# Patient Record
Sex: Male | Born: 1945 | Race: Black or African American | Hispanic: No | Marital: Married | Smoking: Never smoker
Health system: Southern US, Community
[De-identification: ages and names within clinical notes are randomized; demographics above are authoritative.]

## PROBLEM LIST (undated history)

## (undated) DIAGNOSIS — M1990 Unspecified osteoarthritis, unspecified site: Secondary | ICD-10-CM

## (undated) DIAGNOSIS — C801 Malignant (primary) neoplasm, unspecified: Secondary | ICD-10-CM

## (undated) HISTORY — PX: PROSTATE SURGERY: SHX751

---

## 2021-03-14 NOTE — Patient Instructions (Signed)
DUE TO COVID-19 ONLY ONE VISITOR IS ALLOWED TO COME WITH YOU AND STAY IN THE WAITING ROOM ONLY DURING PRE OP AND PROCEDURE DAY OF SURGERY. THE 1 VISITOR  MAY VISIT WITH YOU AFTER SURGERY IN YOUR PRIVATE ROOM DURING VISITING HOURS ONLY!               Curtis Olsen   Your procedure is scheduled on: 03/28/21   Report to [facility] Main  Entrance   Report to admitting at : 7:30 AM     Call this number if you have problems the morning of surgery [phone]    Remember: NO SOLID FOOD AFTER MIDNIGHT THE NIGHT PRIOR TO SURGERY. NOTHING BY MOUTH EXCEPT CLEAR LIQUIDS UNTIL : 7:00 AM. PLEASE FINISH ENSURE DRINK PER SURGEON ORDER  WHICH NEEDS TO BE COMPLETED AT : 7:00 AM.   CLEAR LIQUID DIET  Foods Allowed                                                                     Foods Excluded  Coffee and tea, regular and decaf                             liquids that you cannot  Plain Jell-O any favor except red or purple                                           see through such as: Fruit ices (not with fruit pulp)                                     milk, soups, orange juice  Iced Popsicles                                    All solid food Carbonated beverages, regular and diet                                    Cranberry, grape and apple juices Sports drinks like Gatorade Lightly seasoned clear broth or consume(fat free) Sugar, honey syrup  Sample Menu Breakfast                                Lunch                                     Supper Cranberry juice                    Beef broth                            Chicken broth Jell-O  Grape juice                           Apple juice Coffee or tea                        Jell-O                                      Popsicle                                                Coffee or tea                        Coffee or tea  _____________________________________________________________________  BRUSH YOUR  TEETH MORNING OF SURGERY AND RINSE YOUR MOUTH OUT, NO CHEWING GUM CANDY OR MINTS.                               You may not have any metal on your body including hair pins and              piercings  Do not wear jewelry, lotions, powders or perfumes, deodorant.             Men may shave face and neck.   Do not bring valuables to the hospital. [place].  Contacts, dentures or bridgework may not be worn into surgery.  Leave suitcase in the car. After surgery it may be brought to your room.     Patients discharged the day of surgery will not be allowed to drive home. IF YOU ARE HAVING SURGERY AND GOING HOME THE SAME DAY, YOU MUST HAVE AN ADULT TO DRIVE YOU HOME AND BE WITH YOU FOR 24 HOURS. YOU MAY GO HOME BY TAXI OR UBER OR ORTHERWISE, BUT AN ADULT MUST ACCOMPANY YOU HOME AND STAY WITH YOU FOR 24 HOURS.  Name and phone number of your driver:  Special Instructions: N/A              Please read over the following fact sheets you were given: _____________________________________________________________________           [facility] - Preparing for Surgery Before surgery, you can play an important role.  Because skin is not sterile, your skin needs to be as free of germs as possible.  You can reduce the number of germs on your skin by washing with CHG (chlorahexidine gluconate) soap before surgery.  CHG is an antiseptic cleaner which kills germs and bonds with the skin to continue killing germs even after washing. Please DO NOT use if you have an allergy to CHG or antibacterial soaps.  If your skin becomes reddened/irritated stop using the CHG and inform your nurse when you arrive at Short Stay. Do not shave (including legs and underarms) for at least 48 hours prior to the first CHG shower.  You may shave your face/neck. Please follow these instructions carefully:  1.  Shower with CHG Soap the night before surgery and the  morning of Surgery.  2.   If you choose to wash your hair, wash your hair first as usual with your  normal  shampoo.  3.  After you shampoo, rinse your hair and body thoroughly to remove the  shampoo.                           4.  Use CHG as you would any other liquid soap.  You can apply chg directly  to the skin and wash                       Gently with a scrungie or clean washcloth.  5.  Apply the CHG Soap to your body ONLY FROM THE NECK DOWN.   Do not use on face/ open                           Wound or open sores. Avoid contact with eyes, ears mouth and genitals (private parts).                       Wash face,  Genitals (private parts) with your normal soap.             6.  Wash thoroughly, paying special attention to the area where your surgery  will be performed.  7.  Thoroughly rinse your body with warm water from the neck down.  8.  DO NOT shower/wash with your normal soap after using and rinsing off  the CHG Soap.                9.  Pat yourself dry with a clean towel.            10.  Wear clean pajamas.            11.  Place clean sheets on your bed the night of your first shower and do not  sleep with pets. Day of Surgery : Do not apply any lotions/deodorants the morning of surgery.  Please wear clean clothes to the hospital/surgery center.  FAILURE TO FOLLOW THESE INSTRUCTIONS MAY RESULT IN THE CANCELLATION OF YOUR SURGERY PATIENT SIGNATURE_________________________________  NURSE SIGNATURE__________________________________  ________________________________________________________________________   [facility]- Preparing for Total Shoulder Arthroplasty    Before surgery, you can play an important role. Because skin is not sterile, your skin needs to be as free of germs as possible. You can reduce the number of germs on your skin by using the following products. Benzoyl Peroxide Gel Reduces the number of germs present on the skin Applied twice a day to shoulder area starting two days before surgery     ==================================================================  Please follow these instructions carefully:  BENZOYL PEROXIDE 5% GEL  Please do not use if you have an allergy to benzoyl peroxide.   If your skin becomes reddened/irritated stop using the benzoyl peroxide.  Starting two days before surgery, apply as follows: Apply benzoyl peroxide in the morning and at night. Apply after taking a shower. If you are not taking a shower clean entire shoulder front, back, and side along with the armpit with a clean wet washcloth.  Place a quarter-sized dollop on your shoulder and rub in thoroughly, making sure to cover the front, back, and side of your shoulder, along with the armpit.   2 days before ____ AM   ____ PM  1 day before ____ AM   ____ PM                         Do this twice a day for two days.  (Last application is the night before surgery, AFTER using the CHG soap as described below).  Do NOT apply benzoyl peroxide gel on the day of surgery.   Incentive Spirometer  An incentive spirometer is a tool that can help keep your lungs clear and active. This tool measures how well you are filling your lungs with each breath. Taking long deep breaths may help reverse or decrease the chance of developing breathing (pulmonary) problems (especially infection) following: A long period of time when you are unable to move or be active. BEFORE THE PROCEDURE  If the spirometer includes an indicator to show your best effort, your nurse or respiratory therapist will set it to a desired goal. If possible, sit up straight or lean slightly forward. Try not to slouch. Hold the incentive spirometer in an upright position. INSTRUCTIONS FOR USE  Sit on the edge of your bed if possible, or sit up as far as you can in bed or on a chair. Hold the incentive spirometer in an upright position. Breathe out normally. Place the mouthpiece in your mouth and seal your lips tightly around  it. Breathe in slowly and as deeply as possible, raising the piston or the ball toward the top of the column. Hold your breath for 3-5 seconds or for as long as possible. Allow the piston or ball to fall to the bottom of the column. Remove the mouthpiece from your mouth and breathe out normally. Rest for a few seconds and repeat Steps 1 through 7 at least 10 times every 1-2 hours when you are awake. Take your time and take a few normal breaths between deep breaths. The spirometer may include an indicator to show your best effort. Use the indicator as a goal to work toward during each repetition. After each set of 10 deep breaths, practice coughing to be sure your lungs are clear. If you have an incision (the cut made at the time of surgery), support your incision when coughing by placing a pillow or rolled up towels firmly against it. Once you are able to get out of bed, walk around indoors and cough well. You may stop using the incentive spirometer when instructed by your caregiver.  RISKS AND COMPLICATIONS Take your time so you do not get dizzy or light-headed. If you are in pain, you may need to take or ask for pain medication before doing incentive spirometry. It is harder to take a deep breath if you are having pain. AFTER USE Rest and breathe slowly and easily. It can be helpful to keep track of a log of your progress. Your caregiver can provide you with a simple table to help with this. If you are using the spirometer at home, follow these instructions: [place] IF:  You are having difficultly using the spirometer. You have trouble using the spirometer as often as instructed. Your pain medication is not giving enough relief while using the spirometer. You develop fever of 100.5 F (38.1 C) or higher. SEEK IMMEDIATE MEDICAL CARE IF:  You cough up bloody sputum that had not been present before. You develop fever of 102 F (38.9 C) or greater. You develop worsening pain at or  near the incision site. MAKE SURE YOU:  Understand these instructions.  Will watch your condition. Will get help right away if you are not doing well or get worse. Document Released: 01/18/2007 Document Revised: 11/30/2011 Document Reviewed: 03/21/2007 [facility] Patient Information 2014 [place], [place].   ________________________________________________________________________

## 2021-03-17 ENCOUNTER — Encounter (HOSPITAL_COMMUNITY)
Admission: RE | Admit: 2021-03-17 | Discharge: 2021-03-17 | Disposition: A | Discharge status: Home / Self Care | Payer: No Typology Code available for payment source | Source: Ambulatory Visit | Attending: Orthopedic Surgery | Admitting: Orthopedic Surgery

## 2021-03-17 ENCOUNTER — Other Ambulatory Visit: Payer: Self-pay

## 2021-03-17 ENCOUNTER — Encounter (HOSPITAL_COMMUNITY): Payer: Self-pay

## 2021-03-17 DIAGNOSIS — Z01812 Encounter for preprocedural laboratory examination: Secondary | ICD-10-CM | POA: Diagnosis not present

## 2021-03-17 HISTORY — DX: Malignant (primary) neoplasm, unspecified: C80.1

## 2021-03-17 HISTORY — DX: Unspecified osteoarthritis, unspecified site: M19.90

## 2021-03-17 LAB — CBC
HCT: 43.2 % (ref 39.0–52.0)
Hemoglobin: 14.4 g/dL (ref 13.0–17.0)
MCH: 31.2 pg (ref 26.0–34.0)
MCHC: 33.3 g/dL (ref 30.0–36.0)
MCV: 93.7 fL (ref 80.0–100.0)
Platelets: 255 10*3/uL (ref 150–400)
RBC: 4.61 MIL/uL (ref 4.22–5.81)
RDW: 13.2 % (ref 11.5–15.5)
WBC: 8.4 10*3/uL (ref 4.0–10.5)
nRBC: 0 % (ref 0.0–0.2)

## 2021-03-17 LAB — SURGICAL PCR SCREEN
MRSA, PCR: NEGATIVE
Staphylococcus aureus: NEGATIVE

## 2021-03-17 NOTE — Progress Notes (Signed)
COVID Vaccine Completed: Date COVID Vaccine completed: COVID vaccine manufacturer: [place]   PCP -  Cardiologist -   Chest x-ray -  EKG -  Stress Test -  ECHO -  Cardiac Cath -  Pacemaker/ICD device last checked:  Sleep Study - [place] in [place] CPAP -   Fasting Blood Sugar -  Checks Blood Sugar _____ times a day  Blood Thinner Instructions: Aspirin Instructions: Last Dose:  Anesthesia review:   Patient denies shortness of breath, fever, cough and chest pain at PAT appointment   Patient verbalized understanding of instructions that were given to them at the PAT appointment. Patient was also instructed that they will need to review over the PAT instructions again at home before surgery.

## 2021-03-28 ENCOUNTER — Encounter (HOSPITAL_COMMUNITY)
Admission: RE | Disposition: A | Discharge status: Home / Self Care | Payer: Self-pay | Source: Home / Self Care | Attending: Orthopedic Surgery

## 2021-03-28 ENCOUNTER — Ambulatory Visit (HOSPITAL_COMMUNITY)
Admission: RE | Admit: 2021-03-28 | Discharge: 2021-03-28 | Disposition: A | Discharge status: Home / Self Care | Payer: No Typology Code available for payment source | Attending: Orthopedic Surgery | Admitting: Orthopedic Surgery

## 2021-03-28 ENCOUNTER — Encounter (HOSPITAL_COMMUNITY): Payer: Self-pay | Admitting: Orthopedic Surgery

## 2021-03-28 ENCOUNTER — Ambulatory Visit (HOSPITAL_COMMUNITY): Payer: No Typology Code available for payment source | Admitting: Certified Registered"

## 2021-03-28 DIAGNOSIS — Z7982 Long term (current) use of aspirin: Secondary | ICD-10-CM | POA: Diagnosis not present

## 2021-03-28 DIAGNOSIS — M75101 Unspecified rotator cuff tear or rupture of right shoulder, not specified as traumatic: Secondary | ICD-10-CM | POA: Insufficient documentation

## 2021-03-28 DIAGNOSIS — M25711 Osteophyte, right shoulder: Secondary | ICD-10-CM | POA: Diagnosis not present

## 2021-03-28 DIAGNOSIS — Z79899 Other long term (current) drug therapy: Secondary | ICD-10-CM | POA: Diagnosis not present

## 2021-03-28 HISTORY — PX: REVERSE SHOULDER ARTHROPLASTY: SHX5054

## 2021-03-28 SURGERY — ARTHROPLASTY, SHOULDER, TOTAL, REVERSE
Anesthesia: General | Site: Shoulder | Laterality: Right

## 2021-03-28 MED ORDER — OXYCODONE HCL 5 MG PO TABS: 5.0000 mg | ORAL_TABLET | Freq: Once | ORAL | Status: DC | PRN

## 2021-03-28 MED ORDER — EPHEDRINE SULFATE-NACL 50-0.9 MG/10ML-% IV SOSY
PREFILLED_SYRINGE | INTRAVENOUS | Status: DC | PRN
  Administered 2021-03-28: 5 mg via INTRAVENOUS
  Administered 2021-03-28: 10 mg via INTRAVENOUS
  Administered 2021-03-28: 5 mg via INTRAVENOUS

## 2021-03-28 MED ORDER — NAPROXEN 500 MG PO TABS: 500.0000 mg | ORAL_TABLET | Freq: Two times a day (BID) | ORAL | 1 refills | Status: AC

## 2021-03-28 MED ORDER — DEXAMETHASONE SODIUM PHOSPHATE 10 MG/ML IJ SOLN
INTRAMUSCULAR | Status: DC | PRN
  Administered 2021-03-28: 8 mg via INTRAVENOUS

## 2021-03-28 MED ORDER — FENTANYL CITRATE (PF) 100 MCG/2ML IJ SOLN
50.0000 ug | INTRAMUSCULAR | Status: DC
  Administered 2021-03-28: 50 ug via INTRAVENOUS
  Filled 2021-03-28: qty 2

## 2021-03-28 MED ORDER — OXYCODONE HCL 5 MG/5ML PO SOLN: 5.0000 mg | Freq: Once | ORAL | Status: DC | PRN

## 2021-03-28 MED ORDER — FENTANYL CITRATE (PF) 100 MCG/2ML IJ SOLN
INTRAMUSCULAR | Status: DC | PRN
  Administered 2021-03-28: 50 ug via INTRAVENOUS

## 2021-03-28 MED ORDER — FENTANYL CITRATE (PF) 100 MCG/2ML IJ SOLN
INTRAMUSCULAR | Status: AC
  Filled 2021-03-28: qty 2

## 2021-03-28 MED ORDER — PHENYLEPHRINE 40 MCG/ML (10ML) SYRINGE FOR IV PUSH (FOR BLOOD PRESSURE SUPPORT)
PREFILLED_SYRINGE | INTRAVENOUS | Status: DC | PRN
  Administered 2021-03-28 (×4): 80 ug via INTRAVENOUS

## 2021-03-28 MED ORDER — ONDANSETRON HCL 4 MG PO TABS: 4.0000 mg | ORAL_TABLET | Freq: Three times a day (TID) | ORAL | 0 refills | Status: AC | PRN

## 2021-03-28 MED ORDER — VANCOMYCIN HCL 1000 MG IV SOLR
INTRAVENOUS | Status: DC | PRN
Start: 2021-03-28 — End: 2021-03-28
  Administered 2021-03-28: 1000 mg

## 2021-03-28 MED ORDER — ROCURONIUM BROMIDE 100 MG/10ML IV SOLN
INTRAVENOUS | Status: DC | PRN
  Administered 2021-03-28: 60 mg via INTRAVENOUS

## 2021-03-28 MED ORDER — CEFAZOLIN SODIUM-DEXTROSE 2-4 GM/100ML-% IV SOLN
2.0000 g | INTRAVENOUS | Status: AC
  Administered 2021-03-28: 2 g via INTRAVENOUS
  Filled 2021-03-28: qty 100

## 2021-03-28 MED ORDER — LIDOCAINE 2% (20 MG/ML) 5 ML SYRINGE
INTRAMUSCULAR | Status: DC | PRN
  Administered 2021-03-28: 60 mg via INTRAVENOUS

## 2021-03-28 MED ORDER — PROPOFOL 10 MG/ML IV BOLUS
INTRAVENOUS | Status: DC | PRN
  Administered 2021-03-28: 160 mg via INTRAVENOUS
  Administered 2021-03-28: 40 mg via INTRAVENOUS

## 2021-03-28 MED ORDER — VANCOMYCIN HCL 1000 MG IV SOLR
INTRAVENOUS | Status: AC
  Filled 2021-03-28: qty 1000

## 2021-03-28 MED ORDER — SUGAMMADEX SODIUM 200 MG/2ML IV SOLN
INTRAVENOUS | Status: DC | PRN
  Administered 2021-03-28: 150 mg via INTRAVENOUS

## 2021-03-28 MED ORDER — TRANEXAMIC ACID 1000 MG/10ML IV SOLN: 1000.0000 mg | INTRAVENOUS | Status: DC

## 2021-03-28 MED ORDER — ACETAMINOPHEN 160 MG/5ML PO SOLN: 325.0000 mg | ORAL | Status: DC | PRN

## 2021-03-28 MED ORDER — ROCURONIUM BROMIDE 10 MG/ML (PF) SYRINGE
PREFILLED_SYRINGE | INTRAVENOUS | Status: AC
  Filled 2021-03-28: qty 10

## 2021-03-28 MED ORDER — PROPOFOL 10 MG/ML IV BOLUS
INTRAVENOUS | Status: AC
  Filled 2021-03-28: qty 20

## 2021-03-28 MED ORDER — CYCLOBENZAPRINE HCL 10 MG PO TABS: 10.0000 mg | ORAL_TABLET | Freq: Three times a day (TID) | ORAL | 1 refills | Status: AC | PRN

## 2021-03-28 MED ORDER — ONDANSETRON HCL 4 MG/2ML IJ SOLN: 4.0000 mg | Freq: Once | INTRAMUSCULAR | Status: DC | PRN

## 2021-03-28 MED ORDER — PHENYLEPHRINE HCL-NACL 10-0.9 MG/250ML-% IV SOLN
INTRAVENOUS | Status: DC | PRN
  Administered 2021-03-28: 30 ug/min via INTRAVENOUS

## 2021-03-28 MED ORDER — OXYCODONE-ACETAMINOPHEN 5-325 MG PO TABS: 1.0000 | ORAL_TABLET | ORAL | 0 refills | Status: AC | PRN

## 2021-03-28 MED ORDER — KETOROLAC TROMETHAMINE 15 MG/ML IJ SOLN
15.0000 mg | Freq: Once | INTRAMUSCULAR | Status: AC
  Administered 2021-03-28: 15 mg via INTRAVENOUS

## 2021-03-28 MED ORDER — DEXAMETHASONE SODIUM PHOSPHATE 10 MG/ML IJ SOLN
INTRAMUSCULAR | Status: AC
  Filled 2021-03-28: qty 1

## 2021-03-28 MED ORDER — SODIUM CHLORIDE 0.9 % IR SOLN
Status: DC | PRN
  Administered 2021-03-28: 1000 mL

## 2021-03-28 MED ORDER — LACTATED RINGERS IV SOLN: INTRAVENOUS | Status: DC

## 2021-03-28 MED ORDER — MIDAZOLAM HCL 2 MG/2ML IJ SOLN
1.0000 mg | INTRAMUSCULAR | Status: DC
  Administered 2021-03-28: 1 mg via INTRAVENOUS
  Filled 2021-03-28: qty 2

## 2021-03-28 MED ORDER — ONDANSETRON HCL 4 MG/2ML IJ SOLN
INTRAMUSCULAR | Status: AC
  Filled 2021-03-28: qty 2

## 2021-03-28 MED ORDER — KETOROLAC TROMETHAMINE 15 MG/ML IJ SOLN
INTRAMUSCULAR | Status: AC
  Filled 2021-03-28: qty 1

## 2021-03-28 MED ORDER — FENTANYL CITRATE (PF) 100 MCG/2ML IJ SOLN: 25.0000 ug | INTRAMUSCULAR | Status: DC | PRN

## 2021-03-28 MED ORDER — BUPIVACAINE LIPOSOME 1.3 % IJ SUSP
INTRAMUSCULAR | Status: DC | PRN
  Administered 2021-03-28 (×5): 2 mL via PERINEURAL

## 2021-03-28 MED ORDER — LACTATED RINGERS IV BOLUS
500.0000 mL | Freq: Once | INTRAVENOUS | Status: AC
Start: 2021-03-28 — End: 2021-03-28
  Administered 2021-03-28: 500 mL via INTRAVENOUS

## 2021-03-28 MED ORDER — PHENYLEPHRINE HCL (PRESSORS) 10 MG/ML IV SOLN
INTRAVENOUS | Status: AC
  Filled 2021-03-28: qty 1

## 2021-03-28 MED ORDER — BUPIVACAINE-EPINEPHRINE (PF) 0.5% -1:200000 IJ SOLN
INTRAMUSCULAR | Status: DC | PRN
  Administered 2021-03-28 (×5): 3 mL via PERINEURAL

## 2021-03-28 MED ORDER — ORAL CARE MOUTH RINSE: 15.0000 mL | Freq: Once | OROMUCOSAL | Status: AC

## 2021-03-28 MED ORDER — ACETAMINOPHEN 10 MG/ML IV SOLN: 1000.0000 mg | Freq: Once | INTRAVENOUS | Status: DC | PRN

## 2021-03-28 MED ORDER — TRANEXAMIC ACID-NACL 1000-0.7 MG/100ML-% IV SOLN
1000.0000 mg | INTRAVENOUS | Status: AC
  Administered 2021-03-28: 1000 mg via INTRAVENOUS
  Filled 2021-03-28: qty 100

## 2021-03-28 MED ORDER — CHLORHEXIDINE GLUCONATE 0.12 % MT SOLN
15.0000 mL | Freq: Once | OROMUCOSAL | Status: AC
  Administered 2021-03-28: 15 mL via OROMUCOSAL

## 2021-03-28 MED ORDER — ONDANSETRON HCL 4 MG/2ML IJ SOLN
INTRAMUSCULAR | Status: DC | PRN
  Administered 2021-03-28: 4 mg via INTRAVENOUS

## 2021-03-28 MED ORDER — LACTATED RINGERS IV BOLUS
250.0000 mL | Freq: Once | INTRAVENOUS | Status: AC
  Administered 2021-03-28: 250 mL via INTRAVENOUS

## 2021-03-28 MED ORDER — ACETAMINOPHEN 325 MG PO TABS: 325.0000 mg | ORAL_TABLET | ORAL | Status: DC | PRN

## 2021-03-28 SURGICAL SUPPLY — 67 items
BAG COUNTER SPONGE SURGICOUNT (BAG) ×2 IMPLANT
BAG ZIPLOCK 12X15 (MISCELLANEOUS) ×2 IMPLANT
BLADE SAW SGTL 83.5X18.5 (BLADE) ×2 IMPLANT
COOLER ICEMAN CLASSIC (MISCELLANEOUS) ×2 IMPLANT
COVER BACK TABLE 60X90IN (DRAPES) ×2 IMPLANT
COVER SURGICAL LIGHT HANDLE (MISCELLANEOUS) ×2 IMPLANT
CUP SUT UNIV REVERS 39 NEU (Shoulder) ×2 IMPLANT
DERMABOND ADVANCED (GAUZE/BANDAGES/DRESSINGS) ×1
DERMABOND ADVANCED .7 DNX12 (GAUZE/BANDAGES/DRESSINGS) ×1 IMPLANT
DRAPE INCISE IOBAN 66X45 STRL (DRAPES) IMPLANT
DRAPE ORTHO SPLIT 77X108 STRL (DRAPES) ×2
DRAPE SHEET LG 3/4 BI-LAMINATE (DRAPES) ×2 IMPLANT
DRAPE SURG 17X11 SM STRL (DRAPES) ×2 IMPLANT
DRAPE SURG ORHT 6 SPLT 77X108 (DRAPES) ×2 IMPLANT
DRAPE TOP 10253 STERILE (DRAPES) ×2 IMPLANT
DRAPE U-SHAPE 47X51 STRL (DRAPES) ×2 IMPLANT
DRESSING AQUACEL AG SP 3.5X6 (GAUZE/BANDAGES/DRESSINGS) ×1 IMPLANT
DRSG AQUACEL AG ADV 3.5X 6 (GAUZE/BANDAGES/DRESSINGS) ×2 IMPLANT
DRSG AQUACEL AG ADV 3.5X10 (GAUZE/BANDAGES/DRESSINGS) IMPLANT
DRSG AQUACEL AG SP 3.5X6 (GAUZE/BANDAGES/DRESSINGS) ×2
DURAPREP 26ML APPLICATOR (WOUND CARE) ×2 IMPLANT
ELECT BLADE TIP CTD 4 INCH (ELECTRODE) ×2 IMPLANT
ELECT REM PT RETURN 15FT ADLT (MISCELLANEOUS) ×2 IMPLANT
FACESHIELD WRAPAROUND (MASK) ×8 IMPLANT
GLENOID UNI REV MOD 24 +2 LAT (Joint) ×2 IMPLANT
GLENOSPHERE 39+4 LAT/24 UNI RV (Joint) ×2 IMPLANT
GLOVE SRG 8 PF TXTR STRL LF DI (GLOVE) ×1 IMPLANT
GLOVE SURG ENC MOIS LTX SZ7 (GLOVE) ×2 IMPLANT
GLOVE SURG ENC MOIS LTX SZ7.5 (GLOVE) ×2 IMPLANT
GLOVE SURG UNDER POLY LF SZ7 (GLOVE) ×2 IMPLANT
GLOVE SURG UNDER POLY LF SZ8 (GLOVE) ×1
GOWN STRL REUS W/TWL LRG LVL3 (GOWN DISPOSABLE) ×4 IMPLANT
INSERT HUMERAL MED 39/ +3 (Shoulder) ×1 IMPLANT
INSERT MEDIUM HUMERAL 39/ +3 (Shoulder) ×1 IMPLANT
KIT BASIN OR (CUSTOM PROCEDURE TRAY) ×2 IMPLANT
KIT TURNOVER KIT A (KITS) ×2 IMPLANT
MANIFOLD NEPTUNE II (INSTRUMENTS) ×2 IMPLANT
NEEDLE TAPERED W/ NITINOL LOOP (MISCELLANEOUS) ×2 IMPLANT
NS IRRIG 1000ML POUR BTL (IV SOLUTION) ×2 IMPLANT
PACK SHOULDER (CUSTOM PROCEDURE TRAY) ×2 IMPLANT
PAD ARMBOARD 7.5X6 YLW CONV (MISCELLANEOUS) ×2 IMPLANT
PAD COLD SHLDR WRAP-ON (PAD) ×2 IMPLANT
PIN NITINOL TARGETER 2.8 (PIN) IMPLANT
PIN SET MODULAR GLENOID SYSTEM (PIN) ×2 IMPLANT
RESTRAINT HEAD UNIVERSAL NS (MISCELLANEOUS) ×2 IMPLANT
SCREW CENTRAL MOD 30MM (Screw) ×2 IMPLANT
SCREW PERI LOCK 5.5X16 (Screw) ×2 IMPLANT
SCREW PERI LOCK 5.5X24 (Screw) ×2 IMPLANT
SCREW PERIPHERAL 5.5X20 LOCK (Screw) ×4 IMPLANT
SLING ARM FOAM STRAP LRG (SOFTGOODS) ×2 IMPLANT
SLING ARM FOAM STRAP MED (SOFTGOODS) IMPLANT
SPONGE T-LAP 18X18 ~~LOC~~+RFID (SPONGE) ×2 IMPLANT
SPONGE T-LAP 4X18 ~~LOC~~+RFID (SPONGE) ×2 IMPLANT
STEM HUMERAL UNI REVERS SZ9 (Stem) ×2 IMPLANT
SUCTION FRAZIER HANDLE 12FR (TUBING)
SUCTION TUBE FRAZIER 12FR DISP (TUBING) IMPLANT
SUT FIBERWIRE #2 38 T-5 BLUE (SUTURE)
SUT MNCRL AB 3-0 PS2 18 (SUTURE) ×2 IMPLANT
SUT MON AB 2-0 CT1 36 (SUTURE) ×2 IMPLANT
SUT VIC AB 1 CT1 36 (SUTURE) ×2 IMPLANT
SUTURE FIBERWR #2 38 T-5 BLUE (SUTURE) IMPLANT
SUTURE TAPE 1.3 40 TPR END (SUTURE) ×2 IMPLANT
SUTURETAPE 1.3 40 TPR END (SUTURE) ×4
TOWEL OR 17X26 10 PK STRL BLUE (TOWEL DISPOSABLE) ×2 IMPLANT
TOWEL OR NON WOVEN STRL DISP B (DISPOSABLE) ×2 IMPLANT
WATER STERILE IRR 1000ML POUR (IV SOLUTION) ×4 IMPLANT
YANKAUER SUCT BULB TIP 10FT TU (MISCELLANEOUS) ×2 IMPLANT

## 2021-03-28 NOTE — Evaluation (Signed)
Occupational Therapy Evaluation Patient Details Name: Curtis Olsen MRN: [id number] DOB: 01-30-1946 Today's Date: 03/28/2021    History of Present Illness patient is a 75 year old male who underwent a right reverse total shoulder. post op was unremarkable.  WIO:MBTDHRCBU, CA.   Clinical Impression   s/p shoulder replacement without functional use of right upper extremity secondary to effects of surgery and interscalene block and shoulder precautions. Therapist provided education and instruction to patient and spouse in regards to exercises, precautions, positioning, donning upper extremity clothing and bathing while maintaining shoulder precautions, ice and edema management and donning/doffing sling. Patient and spouse verbalized understanding and demonstrated as needed. Patient needed assistance to donn shirt, underwear, pants, socks and shoes and provided with instruction on compensatory strategies to perform ADLs. Patient to follow up with MD for further therapy needs.      Follow Up Recommendations  Follow surgeon's recommendation for DC plan and follow-up therapies    Equipment Recommendations       Recommendations for Other Services       Precautions / Restrictions Precautions Precautions: Shoulder Type of Shoulder Precautions: R shoulder. Precaution Comments: total reverse shoulder precautions. Restrictions Weight Bearing Restrictions: Yes RUE Weight Bearing: Non weight bearing Other Position/Activity Restrictions: ER 20 ABD 45 FE 60 PROM, AAROM, or AROM as pain will allow. patient ok to come out of sling when supported at rest.      Mobility Bed Mobility                    Transfers                      Balance Overall balance assessment: Modified Independent                                         ADL either performed or assessed with clinical judgement   ADL Overall ADL's : Needs assistance/impaired Eating/Feeding: Set  up;Sitting   Grooming: Sitting;Set up;Oral care;Wash/dry face;Wash/dry hands   Upper Body Bathing: Sitting;Minimal assistance   Lower Body Bathing: Minimal assistance;Set up;Sitting/lateral leans   Upper Body Dressing : Minimal assistance   Lower Body Dressing: Minimal assistance   Toilet Transfer: Supervision/safety   Toileting- Clothing Manipulation and Hygiene: Minimal assistance;Sit to/from stand       Functional mobility during ADLs: Supervision/safety       Vision   Vision Assessment?: No apparent visual deficits                Pertinent Vitals/Pain Pain Assessment: Faces Faces Pain Scale: No hurt Pain Location: patinet has nerve block in place on RUE at this time     Hand Dominance     Extremity/Trunk Assessment Upper Extremity Assessment Upper Extremity Assessment: RUE deficits/detail RUE Deficits / Details: right total reverse shoulder RUE Coordination: decreased fine motor;decreased gross motor   Lower Extremity Assessment Lower Extremity Assessment: Overall WFL for tasks assessed   Cervical / Trunk Assessment Cervical / Trunk Assessment: Normal   Communication Communication Communication: HOH   Cognition Arousal/Alertness: Awake/alert Behavior During Therapy: WFL for tasks assessed/performed Overall Cognitive Status: Within Functional Limits for tasks assessed  Shoulder Instructions Shoulder Instructions Method for sponge bathing under operated UE: Caregiver independent with task;Modified independent Donning/doffing sling/immobilizer: Caregiver independent with task;Modified independent Correct positioning of sling/immobilizer: Caregiver independent with task;Modified independent Pendulum exercises (written home exercise program): Caregiver independent with task;Modified independent ROM for elbow, wrist and digits of operated UE: Caregiver independent with task;Modified  independent Sling wearing schedule (on at all times/off for ADL's): Caregiver independent with task;Modified independent Positioning of UE while sleeping: Modified independent;Caregiver independent with task    Home Living Family/patient expects to be discharged to:: Private residence Living Arrangements: Spouse/significant other Available Help at Discharge: Family;Available 24 hours/day Type of Home: House                                  Prior Functioning/Environment Level of Independence: Independent                  AM-PAC OT "6 Clicks" Daily Activity     Outcome Measure Help from another person eating meals?: A Little Help from another person taking care of personal grooming?: A Little Help from another person toileting, which includes using toliet, bedpan, or urinal?: A Little Help from another person bathing (including washing, rinsing, drying)?: A Little Help from another person to put on and taking off regular upper body clothing?: A Little Help from another person to put on and taking off regular lower body clothing?: A Little 6 Click Score: 18   End of Session Nurse Communication:  (nurse cleared patient to participate.)  Activity Tolerance: Patient tolerated treatment well Patient left: with family/visitor present;in chair  OT Visit Diagnosis: Muscle weakness (generalized) (M62.81)                Time: 1247-1310 OT Time Calculation (min): 23 min Charges:  OT General Charges $OT Visit: 1 Visit OT Evaluation $OT Eval Low Complexity: 1 Low OT Treatments $Self Care/Home Management : 8-22 mins  Jackelyn Poling OTR/L, MS Acute Rehabilitation Department Office# [phone] Pager# [address] 03/28/2021, 1:24 PM

## 2021-03-28 NOTE — Transfer of Care (Signed)
Immediate Anesthesia Transfer of Care Note  Patient: Curtis Olsen  Procedure(s) Performed: REVERSE SHOULDER ARTHROPLASTY (Right: Shoulder)  Patient Location: PACU  Anesthesia Type:General and Regional  Level of Consciousness: drowsy  Airway & Oxygen Therapy: Patient Spontanous Breathing and Patient connected to face mask oxygen  Post-op Assessment: Report given to RN and Post -op Vital signs reviewed and stable  Post vital signs: Reviewed and stable  Last Vitals:  Vitals Value Taken Time  BP 149/59 03/28/21 1137  Temp    Pulse 76 03/28/21 1139  Resp 19 03/28/21 1139  SpO2 99 % 03/28/21 1139  Vitals shown include unvalidated device data.  Last Pain:  Vitals:   03/28/21 0751  TempSrc: Oral         Complications: No notable events documented.

## 2021-03-28 NOTE — Anesthesia Postprocedure Evaluation (Signed)
Anesthesia Post Note  Patient: Curtis Olsen  Procedure(s) Performed: REVERSE SHOULDER ARTHROPLASTY (Right: Shoulder)     Patient location during evaluation: PACU Anesthesia Type: General Level of consciousness: awake and alert and oriented Pain management: pain level controlled Vital Signs Assessment: post-procedure vital signs reviewed and stable Respiratory status: spontaneous breathing, nonlabored ventilation and respiratory function stable Cardiovascular status: blood pressure returned to baseline Postop Assessment: no apparent nausea or vomiting Anesthetic complications: no   No notable events documented.  Last Vitals:  Vitals:   03/28/21 1245 03/28/21 1320  BP: (!) 160/94 (!) 157/76  Pulse: 79 90  Resp:    Temp:  36.6 C  SpO2: 95% 99%    Last Pain:  Vitals:   03/28/21 1320  TempSrc:   PainSc: 0-No pain                 Brennan Bailey

## 2021-03-28 NOTE — Anesthesia Preprocedure Evaluation (Signed)
Anesthesia Evaluation  Patient identified by MRN, date of birth, ID band Patient awake    Reviewed: Allergy & Precautions, NPO status , Patient's Chart, lab work & pertinent test results  Airway Mallampati: I       Dental  (+) Poor Dentition   Pulmonary neg pulmonary ROS,    Pulmonary exam normal        Cardiovascular negative cardio ROS Normal cardiovascular exam     Neuro/Psych negative psych ROS   GI/Hepatic Neg liver ROS,   Endo/Other  negative endocrine ROS  Renal/GU negative Renal ROS  negative genitourinary   Musculoskeletal   Abdominal Normal abdominal exam  (+)   Peds  Hematology negative hematology ROS (+)   Anesthesia Other Findings   Reproductive/Obstetrics                             Anesthesia Physical Anesthesia Plan  ASA: 2  Anesthesia Plan: General   Post-op Pain Management:  Regional for Post-op pain   Induction: Intravenous  PONV Risk Score and Plan: Ondansetron, Dexamethasone and Midazolam  Airway Management Planned: Oral ETT  Additional Equipment: None  Intra-op Plan:   Post-operative Plan: Extubation in OR  Informed Consent: I have reviewed the patients History and Physical, chart, labs and discussed the procedure including the risks, benefits and alternatives for the proposed anesthesia with the patient or authorized representative who has indicated his/her understanding and acceptance.     Dental advisory given  Plan Discussed with: CRNA  Anesthesia Plan Comments:         Anesthesia Quick Evaluation

## 2021-03-28 NOTE — Anesthesia Procedure Notes (Signed)
Anesthesia Regional Block: Interscalene brachial plexus block   Pre-Anesthetic Checklist: , timeout performed,  Correct Patient, Correct Site, Correct Laterality,  Correct Procedure, Correct Position, site marked,  Risks and benefits discussed,  Surgical consent,  Pre-op evaluation,  At surgeon's request and post-op pain management  Laterality: Right and Upper  Prep: chloraprep       Needles:  Injection technique: Single-shot  Needle Type: Echogenic Stimulator Needle     Needle Length: 4cm  Needle Gauge: 20     Additional Needles:   Procedures:,,,, ultrasound used (permanent image in chart),,    Narrative:  Start time: 03/28/2021 8:35 AM End time: 03/28/2021 8:43 AM Injection made incrementally with aspirations every 5 mL.  Performed by: Personally  Anesthesiologist: Lyn Hollingshead, MD

## 2021-03-28 NOTE — Anesthesia Procedure Notes (Signed)
Procedure Name: Intubation Date/Time: 03/28/2021 10:07 AM Performed by: Gwyndolyn Saxon, CRNA Pre-anesthesia Checklist: Patient identified, Emergency Drugs available, Suction available and Patient being monitored Patient Re-evaluated:Patient Re-evaluated prior to induction Oxygen Delivery Method: Circle system utilized Preoxygenation: Pre-oxygenation with 100% oxygen Induction Type: IV induction Ventilation: Mask ventilation without difficulty Laryngoscope Size: Miller and 2 Grade View: Grade I Tube type: Oral Tube size: 7.5 mm Number of attempts: 1 Airway Equipment and Method: Patient positioned with wedge pillow and Stylet Placement Confirmation: ETT inserted through vocal cords under direct vision, positive ETCO2 and breath sounds checked- equal and bilateral Secured at: 23 cm Tube secured with: Tape Dental Injury: Teeth and Oropharynx as per pre-operative assessment  Comments: Poor dentition with multiple broken teeth up top; all dentition intact after intubation

## 2021-03-28 NOTE — H&P (Signed)
Curtis Olsen    Chief Complaint: Right shoulder rotator cuff tear arthropathy HPI: The patient is a 75 y.o. male with chronic and progressively increasing right shoulder pain related to severe rotator cuff tear arthropathy.  Due to his increasing functional imitations and failure to respond to prolonged attempts at conservative management he is brought to the operating this time for planned right shoulder reverse arthroplasty  Past Medical History:  Diagnosis Date   Arthritis    Cancer [facility])     Past Surgical History:  Procedure Laterality Date   PROSTATE SURGERY      History reviewed. No pertinent family history.  Social History:  reports that he has never smoked. He has never been exposed to tobacco smoke. He has never used smokeless tobacco. He reports previous alcohol use. He reports that he does not use drugs.   Medications Prior to Admission  Medication Sig Dispense Refill   aspirin EC 81 MG tablet Take 81 mg by mouth daily. Swallow whole.     omeprazole (PRILOSEC) 20 MG capsule Take 40 mg by mouth daily before supper.     pravastatin (PRAVACHOL) 40 MG tablet Take 40 mg by mouth at bedtime.       Physical Exam: Right shoulder exam demonstrates severely painful and guarded motion as noted at his recent office visits.  He has global weakness to manual muscle testing.  He is otherwise neurovascular intact in the right upper extremity.  Radiographs  Plain films of the right shoulder demonstrate moderate arthrosis.  Narrowing of the acromiohumeral interval.  Recent MRI scan confirms a large and severely retracted tear of the superior rotator cuff with associated muscular fatty atrophy.  Vitals  Temp:  [98.2 F (36.8 C)] 98.2 F (36.8 C) (07/08 0751) Pulse Rate:  [56-78] 59 (07/08 0844) Resp:  [14-21] 14 (07/08 0844) BP: (123-164)/(60-75) 123/60 (07/08 0844) SpO2:  [98 %-100 %] 99 % (07/08 0844)  Assessment/Plan  Impression: Right shoulder rotator cuff tear  arthropathy  Plan of Action: Procedure(s): REVERSE SHOULDER ARTHROPLASTY  [name] M Geniva Lohnes 03/28/2021, 9:10 AM Contact # [phone]

## 2021-03-28 NOTE — Progress Notes (Signed)
Assisted Dr. Hatchett with right, ultrasound guided, interscalene  block. Side rails up, monitors on throughout procedure. See vital signs in flow sheet. Tolerated Procedure well.  

## 2021-03-28 NOTE — Discharge Instructions (Signed)
 Kevin M. Supple, M.D., F.A.A.O.S. [facility] Specializing in Arthroscopic and Reconstructive Surgery of the Shoulder [phone] [address] - [place], [place] [address] - Fax [phone]   POST-OP TOTAL SHOULDER REPLACEMENT INSTRUCTIONS  1. Follow up in the office for your first post-op appointment 10-14 days from the date of your surgery. If you do not already have a scheduled appointment, our office will contact you to schedule.  2. The bandage over your incision is waterproof. You may begin showering with this dressing on. You may leave this dressing on until first follow up appointment within 2 weeks. We prefer you leave this dressing in place until follow up however after 5-7 days if you are having itching or skin irritation and would like to remove it you may do so. Go slow and tug at the borders gently to break the bond the dressing has with the skin. At this point if there is no drainage it is okay to go without a bandage or you may cover it with a light guaze and tape. You can also expect significant bruising around your shoulder that will drift down your arm and into your chest wall. This is very normal and should resolve over several days.   3. Wear your sling/immobilizer at all times except to perform the exercises below or to occasionally let your arm dangle by your side to stretch your elbow. You also need to sleep in your sling immobilizer until instructed otherwise. It is ok to remove your sling if you are sitting in a controlled environment and allow your arm to rest in a position of comfort by your side or on your lap with pillows to give your neck and skin a break from the sling. You may remove it to allow arm to dangle by side to shower. If you are up walking around and when you go to sleep at night you need to wear it.  4. Range of motion to your elbow, wrist, and hand are encouraged 3-5 times daily. Exercise to your hand and fingers helps to reduce  swelling you may experience.   5. Prescriptions for a pain medication and a muscle relaxant are provided for you. It is recommended that if you are experiencing pain that you pain medication alone is not controlling, add the muscle relaxant along with the pain medication which can give additional pain relief. The first 1-2 days is generally the most severe of your pain and then should gradually decrease. As your pain lessens it is recommended that you decrease your use of the pain medications to an "as needed basis'" only and to always comply with the recommended dosages of the pain medications.  6. Pain medications can produce constipation along with their use. If you experience this, the use of an over the counter stool softener or laxative daily is recommended.   7. For additional questions or concerns, please do not hesitate to call the office. If after hours there is an answering service to forward your concerns to the physician on call.  8.Pain control following an exparel block  To help control your post-operative pain you received a nerve block  performed with Exparel which is a long acting anesthetic (numbing agent) which can provide pain relief and sensations of numbness (and relief of pain) in the operative shoulder and arm for up to 3 days. Sometimes it provides mixed relief, meaning you may still have numbness in certain areas of the arm but can still be able to   move  parts of that arm, hand, and fingers. We recommend that your prescribed pain medications  be used as needed. We do not feel it is necessary to "pre medicate" and "stay ahead" of pain.  Taking narcotic pain medications when you are not having any pain can lead to unnecessary and potentially dangerous side effects.    9. Use the ice machine as much as possible in the first 5-7 days from surgery, then you can wean its use to as needed. The ice typically needs to be replaced every 6 hours, instead of ice you can actually freeze  water bottles to put in the cooler and then fill water around them to avoid having to purchase ice. You can have spare water bottles freezing to allow you to rotate them once they have melted. Try to have a thin shirt or light cloth or towel under the ice wrap to protect your skin.   FOR ADDITIONAL INFO ON ICE MACHINE AND INSTRUCTIONS GO TO THE WEBSITE AT  [email or web address]/[email or web address]  10.  We recommend that you avoid any dental work or cleaning in the first 3 months following your joint replacement. This is to help minimize the possibility of infection from the bacteria in your mouth that enters your bloodstream during dental work. We also recommend that you take an antibiotic prior to your dental work for the first year after your shoulder replacement to further help reduce that risk. Please simply contact our office for antibiotics to be sent to your pharmacy prior to dental work.  11. Dental Antibiotics:  In most cases prophylactic antibiotics for Dental procdeures after total joint surgery are not necessary.  Exceptions are as follows:  1. History of prior total joint infection  2. Severely immunocompromised (Organ Transplant, cancer chemotherapy, Rheumatoid biologic meds such as Humera)  3. Poorly controlled diabetes (A1C &gt; 8.0, blood glucose over 200)  If you have one of these conditions, contact your surgeon for an antibiotic prescription, prior to your dental procedure.   POST-OP EXERCISES  Pendulum Exercises  Perform pendulum exercises while standing and bending at the waist. Support your uninvolved arm on a table or chair and allow your operated arm to hang freely. Make sure to do these exercises passively - not using you shoulder muscles. These exercises can be performed once your nerve block effects have worn off.  Repeat 20 times. Do 3 sessions per day.     

## 2021-03-28 NOTE — Op Note (Signed)
03/28/2021  11:19 AM  PATIENT:   Curtis Olsen  75 y.o. male  PRE-OPERATIVE DIAGNOSIS:  Right shoulder rotator cuff tear arthropathy  POST-OPERATIVE DIAGNOSIS: Same  PROCEDURE: Right shoulder reverse arthroplasty utilizing a press-fit size 9 Arthrex stem with a neutral metaphysis, +3 polyethylene insert, 39/+4 glenosphere on a small/+2 baseplate  SURGEON:  Ismerai Bin, Metta Clines M.D.  ASSISTANTS: Jenetta Loges, PA-C  ANESTHESIA:   General endotracheal and interscalene block with Exparel  EBL: 150 cc  SPECIMEN: None  Drains: None   PATIENT DISPOSITION:  PACU - hemodynamically stable.    PLAN OF CARE: Discharge to home after PACU  Brief history:  Mr. Durrell is a 75 year old gentleman who has had chronic and progressive increasing right shoulder pain related to severe rotator cuff dysfunction and osteoarthritis.  Due to his increasing pain and functional rotations he is brought to the operating this time for planned right shoulder reverse arthroplasty.  Preoperatively, I counseled the patient regarding treatment options and risks versus benefits thereof.  Possible surgical complications were all reviewed including potential for bleeding, infection, neurovascular injury, persistent pain, loss of motion, anesthetic complication, failure of the implant, and possible need for additional surgery. They understand and accept and agrees with our planned procedure.   Procedure in detail:  After undergoing routine preop evaluation the patient received prophylactic antibiotics and interscalene block with Exparel was established in the holding area by the anesthesia department.  Patient subsequently placed spine on the operating table and underwent the smooth induction of a general endotracheal anesthesia.  Placed into the beachchair position and appropriately padded and protected.  The right shoulder girdle region was sterilely prepped and draped in standard fashion.  Timeout was called.  A  deltopectoral approach to the right shoulder was made through an 8 cm incision.  Skin flaps were elevated dissection carried deeply and the deltopectoral interval was then elevated from proximal to distal with the vein taken laterally.  The upper centimeter the pectoralis major was tenotomized for exposure the conjoined tendon was mobilized retracted medially and adhesions were divided beneath the deltoid.  There had been previous rupture long head biceps tendon.  The remnant of the rotator cuff superiorly was split from the bicipital groove to the base of the coracoid and the subscapularis was then separated from the lesser tuberosity using electrocautery and the free margin was tagged with a pair of suture tape sutures.  Capsular attachments were then divided from the anterior and infra margins of the humeral neck and humeral head was delivered through the wound.  Extra medullary guide was used to outline our proposed humeral head resection this was performed an oscillating saw at approximate 20 degrees retroversion.  Peripheral osteophytes were then removed with a rondure and a metal cap was then placed over the cut proximal humeral surface.  We then exposed the glenoid with appropriate retractors.  A circumferential labral resection was then completed.  A guidepin was then directed into the center of the glenoid and glenoid was then reamed with the central peripheral reamers to a stable subchondral bony bed and all debris was then carefully removed.  Preparation completed with the central drill and tap.  A 30 mm lag screw was selected.  The baseplate was assembled with a mycin powder applied to the threads of the lag screw it was then inserted with excellent fit and fixation.  The peripheral locking screws were all then placed with excellent purchase and fixation.  A 39/+4 glenosphere was then impacted onto the  baseplate and the central locking screw was placed.  Attention returned to the proximal humerus where  the canal was opened and reaming to size 7 broaching up to size 9 at approximately 20 degrees retroversion.  A neutral metaphyseal reaming guide was placed in the metaphysis was then prepared.  A trial implant was placed with trial reduction showing good motion good stability good soft tissue balance.  At this point the final implant was assembled on the back table and was then impacted after we spread vancomycin powder liberally into the humeral canal.  Trial reduction at this point showed good motion good stability good soft tissue balance with the +3 poly-.  The trial was then removed the final +3 polywas then impacted into the implant and final reduction again showed good motion good stability good soft tissue balance.  We then confirmed good elasticity of the subscapularis and was then repaired back to the eyelets on the collar the implant.  Final hemostasis was obtained.  Irrigation was then completed.  The balance of vancomycin powder was then spread liberally throughout the deep soft tissue layers.  The deltopectoral interval was reapproximated with a series of figure-of-eight and 1 Vicryl sutures.  2-0 Monocryl used to close the subcu layer and intracuticular 3-0 Monocryl for the skin followed by Dermabond and Aquacel dressing.  Right arm is in place of a sling and the patient was awakened, extubated, and taken to the recovery room in stable condition.  Jenetta Loges, PA-C was utilized as an Environmental consultant throughout this case, essential for help with positioning the patient, positioning extremity, tissue manipulation, implantation of the prosthesis, suture management, wound closure, and intraoperative decision-making.  Marin Shutter MD   Contact # [phone]

## 2021-04-03 ENCOUNTER — Encounter (HOSPITAL_COMMUNITY): Payer: Self-pay | Admitting: Orthopedic Surgery

## 2021-10-17 ENCOUNTER — Other Ambulatory Visit (HOSPITAL_COMMUNITY): Payer: Self-pay | Admitting: Orthopedic Surgery

## 2021-10-17 ENCOUNTER — Other Ambulatory Visit: Payer: Self-pay | Admitting: Orthopedic Surgery

## 2021-10-17 DIAGNOSIS — Z96611 Presence of right artificial shoulder joint: Secondary | ICD-10-CM

## 2021-10-28 ENCOUNTER — Encounter (HOSPITAL_COMMUNITY): Payer: No Typology Code available for payment source

## 2021-11-04 ENCOUNTER — Encounter (HOSPITAL_COMMUNITY): Payer: No Typology Code available for payment source

## 2021-11-04 ENCOUNTER — Encounter (HOSPITAL_COMMUNITY): Payer: Self-pay

## 2021-11-04 ENCOUNTER — Other Ambulatory Visit (HOSPITAL_COMMUNITY): Payer: No Typology Code available for payment source

## 2021-12-02 ENCOUNTER — Other Ambulatory Visit (HOSPITAL_COMMUNITY): Payer: No Typology Code available for payment source

## 2021-12-02 ENCOUNTER — Encounter (HOSPITAL_COMMUNITY): Payer: No Typology Code available for payment source

## 2021-12-05 ENCOUNTER — Other Ambulatory Visit: Payer: Self-pay

## 2021-12-05 ENCOUNTER — Encounter (INDEPENDENT_AMBULATORY_CARE_PROVIDER_SITE_OTHER): Payer: Self-pay

## 2021-12-05 ENCOUNTER — Encounter (HOSPITAL_COMMUNITY)
Admission: RE | Admit: 2021-12-05 | Discharge: 2021-12-05 | Disposition: A | Discharge status: Home / Self Care | Payer: No Typology Code available for payment source | Source: Ambulatory Visit | Attending: Orthopedic Surgery | Admitting: Orthopedic Surgery

## 2021-12-05 ENCOUNTER — Ambulatory Visit (HOSPITAL_COMMUNITY)
Admission: RE | Admit: 2021-12-05 | Discharge: 2021-12-05 | Disposition: A | Discharge status: Home / Self Care | Payer: No Typology Code available for payment source | Source: Ambulatory Visit | Attending: Orthopedic Surgery | Admitting: Orthopedic Surgery

## 2021-12-05 DIAGNOSIS — Z96611 Presence of right artificial shoulder joint: Secondary | ICD-10-CM | POA: Diagnosis present

## 2021-12-05 IMAGING — NM NM BONE 3 PHASE
2 series · 12 of 12 positions shown · non-contrast
Comparison: None

Radiographic correlation: None

CLINICAL DATA: RIGHT anterior and lateral humeral head pain
radiating to RIGHT elbow, no recent trauma, history of reverse
shoulder arthroplasty [DATE]

EXAM:
NUCLEAR MEDICINE 3-PHASE BONE SCAN
TECHNIQUE: Radionuclide angiographic images, immediate static blood pool
images, and 3-hour delayed static images were obtained of the
shoulders after intravenous injection of radiopharmaceutical.
RADIOPHARMACEUTICALS:  22.0 mCi [EX] MDP IV

[Series 1: flow · 4.14mm/px · 6 of 40 frames shown (1 of 2)]
[frame 4/40]
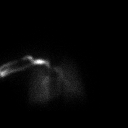
[frame 10/40]
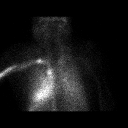
[frame 17/40]
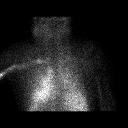
[frame 24/40]
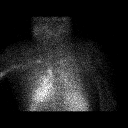
[frame 30/40]
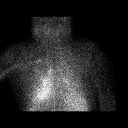
[frame 37/40]
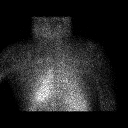

[Series 1: flow · 4.14mm/px · 6 of 40 frames shown (2 of 2)]
[frame 4/40]
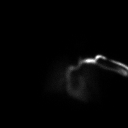
[frame 10/40]
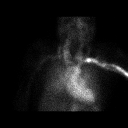
[frame 17/40]
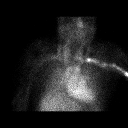
[frame 24/40]
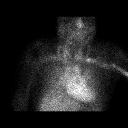
[frame 30/40]
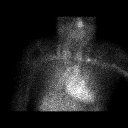
[frame 37/40]
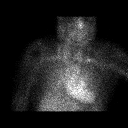

[12 of 12 positions shown; findings below may reference images not displayed]

FINDINGS: Vascular phase: Normal blood flow to both shoulders

Blood pool phase: Normal blood pool at LEFT shoulder. Increased
blood pool surrounding RIGHT shoulder/prosthesis

Delayed phase: Photopenic defect at RIGHT shoulder from reverse
arthroplasty. Mild uptake is seen adjacent to the proximal portion
of the humeral component but not along the remainder of. This
pattern suggests postsurgical changes. No definite scintigraphic
evidence of aseptic loosening or infection. Mild uptake of tracer at
the AC joints and sternoclavicular joints bilaterally typically
degenerative.
IMPRESSION: No scintigraphic evidence of loosening or infection of the RIGHT
shoulder prosthesis identified.

Increased blood pool surrounding RIGHT shoulder region which may be
related to mild hyperemia and prior surgery.

## 2021-12-05 MED ORDER — TECHNETIUM TC 99M MEDRONATE IV KIT
22.0000 | PACK | Freq: Once | INTRAVENOUS | Status: AC
  Administered 2021-12-05: 22 via INTRAVENOUS
